# Patient Record
Sex: Female | Born: 1990 | Race: Black or African American | Hispanic: No | Marital: Single | Smoking: Current every day smoker
Health system: Southern US, Community
[De-identification: ages and names within clinical notes are randomized; demographics above are authoritative.]

## PROBLEM LIST (undated history)

## (undated) DIAGNOSIS — D649 Anemia, unspecified: Secondary | ICD-10-CM

## (undated) DIAGNOSIS — J45909 Unspecified asthma, uncomplicated: Secondary | ICD-10-CM

## (undated) HISTORY — PX: LIPOSUCTION: SHX10

## (undated) HISTORY — PX: SALPINGECTOMY: SHX328

## (undated) HISTORY — PX: ABDOMINAL SURGERY: SHX537

## (undated) HISTORY — PX: BREAST SURGERY: SHX581

---

## 2013-08-06 ENCOUNTER — Emergency Department (HOSPITAL_COMMUNITY)
Admission: EM | Admit: 2013-08-06 | Discharge: 2013-08-06 | Disposition: A | Discharge status: Home / Self Care | Payer: Self-pay | Attending: Emergency Medicine | Admitting: Emergency Medicine

## 2013-08-06 ENCOUNTER — Encounter (HOSPITAL_COMMUNITY): Payer: Self-pay | Admitting: Emergency Medicine

## 2013-08-06 DIAGNOSIS — N390 Urinary tract infection, site not specified: Secondary | ICD-10-CM | POA: Insufficient documentation

## 2013-08-06 DIAGNOSIS — F411 Generalized anxiety disorder: Secondary | ICD-10-CM | POA: Insufficient documentation

## 2013-08-06 DIAGNOSIS — N61 Mastitis without abscess: Secondary | ICD-10-CM | POA: Insufficient documentation

## 2013-08-06 DIAGNOSIS — J45909 Unspecified asthma, uncomplicated: Secondary | ICD-10-CM | POA: Insufficient documentation

## 2013-08-06 DIAGNOSIS — R112 Nausea with vomiting, unspecified: Secondary | ICD-10-CM | POA: Insufficient documentation

## 2013-08-06 DIAGNOSIS — F17200 Nicotine dependence, unspecified, uncomplicated: Secondary | ICD-10-CM | POA: Insufficient documentation

## 2013-08-06 DIAGNOSIS — Z3202 Encounter for pregnancy test, result negative: Secondary | ICD-10-CM | POA: Insufficient documentation

## 2013-08-06 DIAGNOSIS — N611 Abscess of the breast and nipple: Secondary | ICD-10-CM

## 2013-08-06 HISTORY — DX: Unspecified asthma, uncomplicated: J45.909

## 2013-08-06 LAB — URINE MICROSCOPIC-ADD ON

## 2013-08-06 LAB — URINALYSIS, ROUTINE W REFLEX MICROSCOPIC
Bilirubin Urine: NEGATIVE
Glucose, UA: NEGATIVE mg/dL
Ketones, ur: NEGATIVE mg/dL
Nitrite: POSITIVE — AB
PROTEIN: 30 mg/dL — AB
Specific Gravity, Urine: 1.027 (ref 1.005–1.030)
Urobilinogen, UA: 1 mg/dL (ref 0.0–1.0)
pH: 7 (ref 5.0–8.0)

## 2013-08-06 LAB — PREGNANCY, URINE: Preg Test, Ur: NEGATIVE

## 2013-08-06 MED ORDER — MORPHINE SULFATE 4 MG/ML IJ SOLN
4.0000 mg | Freq: Once | INTRAMUSCULAR | Status: AC
  Administered 2013-08-06: 4 mg via INTRAVENOUS
  Filled 2013-08-06: qty 1

## 2013-08-06 MED ORDER — SULFAMETHOXAZOLE-TRIMETHOPRIM 800-160 MG PO TABS: 1.0000 | ORAL_TABLET | Freq: Two times a day (BID) | ORAL | Status: DC

## 2013-08-06 MED ORDER — HYDROCODONE-ACETAMINOPHEN 5-325 MG PO TABS: 1.0000 | ORAL_TABLET | ORAL | Status: DC | PRN

## 2013-08-06 NOTE — ED Notes (Signed)
Pt present with NAD- pt reports fever chills and "knots to chest"  Green drainage from left breast from piercing 2 weeks ago. Vomited x 1 in 24 hours.

## 2013-08-06 NOTE — Discharge Instructions (Signed)
Abscess An abscess is an infected area that contains a collection of pus and debris.It can occur in almost any part of the body. An abscess is also known as a furuncle or boil. CAUSES  An abscess occurs when tissue gets infected. This can occur from blockage of oil or sweat glands, infection of hair follicles, or a minor injury to the skin. As the body tries to fight the infection, pus collects in the area and creates pressure under the skin. This pressure causes pain. People with weakened immune systems have difficulty fighting infections and get certain abscesses more often.  SYMPTOMS Usually an abscess develops on the skin and becomes a painful mass that is red, warm, and tender. If the abscess forms under the skin, you may feel a moveable soft area under the skin. Some abscesses break open (rupture) on their own, but most will continue to get worse without care. The infection can spread deeper into the body and eventually into the bloodstream, causing you to feel ill.  DIAGNOSIS  Your caregiver will take your medical history and perform a physical exam. A sample of fluid may also be taken from the abscess to determine what is causing your infection. TREATMENT  Your caregiver may prescribe antibiotic medicines to fight the infection. However, taking antibiotics alone usually does not cure an abscess. Your caregiver may need to make a small cut (incision) in the abscess to drain the pus. In some cases, gauze is packed into the abscess to reduce pain and to continue draining the area. HOME CARE INSTRUCTIONS   Only take over-the-counter or prescription medicines for pain, discomfort, or fever as directed by your caregiver.  If you were prescribed antibiotics, take them as directed. Finish them even if you start to feel better.  If gauze is used, follow your caregiver's directions for changing the gauze.  To avoid spreading the infection:  Keep your draining abscess covered with a  bandage.  Wash your hands well.  Do not share personal care items, towels, or whirlpools with others.  Avoid skin contact with others.  Keep your skin and clothes clean around the abscess.  Keep all follow-up appointments as directed by your caregiver. SEEK MEDICAL CARE IF:   You have increased pain, swelling, redness, fluid drainage, or bleeding.  You have muscle aches, chills, or a general ill feeling.  You have a fever. MAKE SURE YOU:   Understand these instructions.  Will watch your condition.  Will get help right away if you are not doing well or get worse. Document Released: 02/04/2005 Document Revised: 10/27/2011 Document Reviewed: 07/10/2011 [facility] Patient Information 2014 [place], [place].  Urinary Tract Infection A urinary tract infection (UTI) can occur any place along the urinary tract. The tract includes the kidneys, ureters, bladder, and urethra. A type of germ called bacteria often causes a UTI. UTIs are often helped with antibiotic medicine.  HOME CARE   If given, take antibiotics as told by your doctor. Finish them even if you start to feel better.  Drink enough fluids to keep your pee (urine) clear or pale yellow.  Avoid tea, drinks with caffeine, and bubbly (carbonated) drinks.  Pee often. Avoid holding your pee in for a long time.  Pee before and after having sex (intercourse).  Wipe from front to back after you poop (bowel movement) if you are a woman. Use each tissue only once. GET HELP RIGHT AWAY IF:   You have back pain.  You have lower belly (abdominal) pain.  You have chills.  You feel sick to your stomach (nauseous).  You throw up (vomit).  Your burning or discomfort with peeing does not go away.  You have a fever.  Your symptoms are not better in 3 days. MAKE SURE YOU:   Understand these instructions.  Will watch your condition.  Will get help right away if you are not doing well or get worse. Document Released:  10/14/2007 Document Revised: 01/20/2012 Document Reviewed: 11/26/2011 [facility] Patient Information 2014 [place].

## 2013-08-06 NOTE — ED Notes (Signed)
Patient now reporting that she is having chest pain and pressure. She has been vomiting as well, Patient having multiple anxiety attacks and had infection to new tattoo.

## 2013-08-06 NOTE — ED Provider Notes (Signed)
INCISION AND DRAINAGE Date/Time: 08/06/2013 6:29 PM Performed by: Coral CeoPALMER, Clearnce Leja K Authorized by: Jillyn LedgerPALMER, Aarit Kashuba K Consent: Verbal consent obtained. Risks and benefits: risks, benefits and alternatives were discussed Consent given by: patient Patient identity confirmed: verbally with patient Type: abscess Body area: trunk Location details: left breast Anesthesia: local infiltration Local anesthetic: lidocaine 2% without epinephrine Anesthetic total: 6 ml Patient sedated: no Scalpel size: 11 Incision type: single straight Complexity: simple Drainage: purulent and  bloody Drainage amount: moderate Wound treatment: drain placed Packing material: 1/4 in iodoform gauze Patient tolerance: Patient tolerated the procedure well with no immediate complications.     Greer EeJessica Katlin Laiden Milles PA-C          Jillyn LedgerJessica K Sam Overbeck, PA-C 08/06/13 [phone]

## 2013-08-06 NOTE — ED Provider Notes (Signed)
CSN: [id number]     Arrival date & time 08/06/13  1521 History   First MD Initiated Contact with Patient 08/06/13 1703     Chief complaint: Left breast pain  HPI Patient presents to the emergency room with a primary complaint of left breast pain. The patient states she had a nipple piercing about 2 weeks ago. Gradually since that time she's developed redness and swelling in her left breast in the medial aspect. The area is tender and swollen. She is also felt feverish and chilled. She's also had a couple episodes of nausea and vomiting. Patient denies any abdominal pain. She has noticed an odor to her urine but no discomfort. Patient also had a tattoo on her left arm.  She has not noticed any redness but Past Medical History  Diagnosis Date  . Asthma    History reviewed. No pertinent past surgical history. No family history on file. History  Substance Use Topics  . Smoking status: Current Every Day Smoker  . Smokeless tobacco: Not on file  . Alcohol Use: Yes   OB History   Grav Para Term Preterm Abortions TAB SAB Ect Mult Living                 Review of Systems  All other systems reviewed and are negative.      Allergies  Review of patient's allergies indicates no known allergies.  Home Medications  No current outpatient prescriptions on file. BP 127/65  Pulse 70  Temp(Src) 97.8 F (36.6 C) (Oral)  Resp 18  Wt 195 lb (88.451 kg)  SpO2 100%  LMP 07/22/2013 Physical Exam  Nursing note and vitals reviewed. Constitutional: She appears well-developed and well-nourished. No distress.  HENT:  Head: Normocephalic and atraumatic.  Right Ear: External ear normal.  Left Ear: External ear normal.  Eyes: Conjunctivae are normal. Right eye exhibits no discharge. Left eye exhibits no discharge. No scleral icterus.  Neck: Neck supple. No tracheal deviation present.  Cardiovascular: Normal rate.   Pulmonary/Chest: Effort normal. No stridor. No respiratory distress. Left breast  exhibits tenderness. Left breast exhibits no nipple discharge.    Musculoskeletal: She exhibits no edema.  Neurological: She is alert. Cranial nerve deficit: no gross deficits.  Skin: Skin is warm and dry. No rash noted.  Tattoo on her left forearm with scaling of the skin but no erythema or drainage, no tenderness  Psychiatric: She has a normal mood and affect.    ED Course  Procedures (including critical care time) EMERGENCY DEPARTMENT US SOFT TISSUE INTERPRETATION "Study: Limited Ultrasound of the noted body part in comments below"  INDICATIONS: Soft tissue infection Multiple views of the body part are obtained with a multi-frequency linear probe  PERFORMED BY:  Myself  IMAGES ARCHIVED?: Yes  SIDE:Left  BODY PART:Breast  FINDINGS: Abcess present  LIMITATIONS:  None   INTERPRETATION:  Abcess present  COMMENT:  Will plan on I&D  Labs Review  Labs Reviewed  URINALYSIS, ROUTINE W REFLEX MICROSCOPIC - Abnormal; Notable for the following:    Color, Urine AMBER (*)    APPearance CLOUDY (*)    Hgb urine dipstick LARGE (*)    Protein, ur 30 (*)    Nitrite POSITIVE (*)    Leukocytes, UA MODERATE (*)    All other components within normal limits  URINE MICROSCOPIC-ADD ON - Abnormal; Notable for the following:    Bacteria, UA MANY (*)    All other components within normal limits  PREGNANCY, URINE  I&D performed by PA Palmer under my supervision  MDM   Final diagnoses:  Breast abscess  UTI (lower urinary tract infection)    Will dc home with rx for septra and pain meds.  Septra to cover uti and skin infection/mrsa coverage.  Recommended pt remove her nipple ring.    Celene KrasJon R Rushie Brazel, MD 08/06/13 [phone]

## 2013-08-08 ENCOUNTER — Telehealth (HOSPITAL_COMMUNITY): Payer: Self-pay

## 2013-08-08 NOTE — ED Notes (Signed)
Pt calling regarding when she should get packing removed.  Pt informed to follow up w/ PCP or to return to have packing removed.  She has not called to schedule f/u w/ [facility].

## 2013-08-09 ENCOUNTER — Emergency Department (HOSPITAL_COMMUNITY)
Admission: EM | Admit: 2013-08-09 | Discharge: 2013-08-09 | Disposition: A | Discharge status: Home / Self Care | Payer: Self-pay | Attending: Emergency Medicine | Admitting: Emergency Medicine

## 2013-08-09 ENCOUNTER — Encounter (HOSPITAL_COMMUNITY): Payer: Self-pay | Admitting: Emergency Medicine

## 2013-08-09 DIAGNOSIS — L0390 Cellulitis, unspecified: Secondary | ICD-10-CM

## 2013-08-09 DIAGNOSIS — J45909 Unspecified asthma, uncomplicated: Secondary | ICD-10-CM | POA: Insufficient documentation

## 2013-08-09 DIAGNOSIS — R110 Nausea: Secondary | ICD-10-CM | POA: Insufficient documentation

## 2013-08-09 DIAGNOSIS — Z792 Long term (current) use of antibiotics: Secondary | ICD-10-CM | POA: Insufficient documentation

## 2013-08-09 DIAGNOSIS — R509 Fever, unspecified: Secondary | ICD-10-CM | POA: Insufficient documentation

## 2013-08-09 DIAGNOSIS — R42 Dizziness and giddiness: Secondary | ICD-10-CM | POA: Insufficient documentation

## 2013-08-09 DIAGNOSIS — F17200 Nicotine dependence, unspecified, uncomplicated: Secondary | ICD-10-CM | POA: Insufficient documentation

## 2013-08-09 DIAGNOSIS — Z5189 Encounter for other specified aftercare: Secondary | ICD-10-CM

## 2013-08-09 DIAGNOSIS — N61 Mastitis without abscess: Secondary | ICD-10-CM | POA: Insufficient documentation

## 2013-08-09 MED ORDER — CLINDAMYCIN HCL 150 MG PO CAPS: 300.0000 mg | ORAL_CAPSULE | Freq: Three times a day (TID) | ORAL | Status: DC

## 2013-08-09 MED ORDER — VANCOMYCIN HCL IN DEXTROSE 1-5 GM/200ML-% IV SOLN
1000.0000 mg | INTRAVENOUS | Status: DC
  Filled 2013-08-09: qty 200

## 2013-08-09 NOTE — ED Notes (Signed)
Pt a+ox4, reports here to have packing removed from L breast abcess.  Packing placed x2 days ago.  Pt reports inc in pain to site, 9/10, at this time.  Pt denies fevers/chills.  Skin otherwise PWD.  NAD.

## 2013-08-09 NOTE — ED Provider Notes (Signed)
Medical screening examination/treatment/procedure(s) were conducted as a shared visit with non-physician practitioner(s) and myself.  I personally evaluated the patient during the encounter.   EKG Interpretation None      Patient presents for increasing pain and redness overlying an area of abscess on the left breast. Region is upper medial aspect of the breast. Reviewing the records from previous visit did reveal moderate amount of abscess drainage. She reports increased pain. I recommended repeat I&D to ensure there was no retained pockets of pus. This procedure was performed (see separate note) and no more pus was encountered. The original I&D site is fairly splayed open, there is no significant purulence drainage. I did not feel repacking was necessary. Will increase antibiotic coverage, follow up with general surgery. Return to the ER if symptoms worsen.  Gilda Creasehristopher J. Jujhar Everett, MD 08/09/13 1750

## 2013-08-09 NOTE — ED Provider Notes (Signed)
Procedure scribed for Gilda Creasehristopher J. Zerrick Hanssen by Greggory StallionKayla Andersen, ED scribe at 5:30 PM.   INCISION AND DRAINAGE Performed by: Gilda Creasehristopher J. Blanton Kardell, MD Consent: Verbal consent obtained. Risks and benefits: risks, benefits and alternatives were discussed Type: abscess  Body area: left breast  Anesthesia: local infiltration  Incision was made with a scalpel.  Local anesthetic: lidocaine 2% with epinephrine  Anesthetic total: 8 ml  Complexity: complex Blunt dissection to break up loculations  Drainage: blood  Drainage amount: mild  Packing material: none  Patient tolerance: Patient tolerated the procedure well with no immediate complications.    Gilda Creasehristopher J. Witten Certain, MD 08/09/13 [phone]

## 2013-08-09 NOTE — ED Provider Notes (Signed)
CSN: [id number]     Arrival date & time 08/09/13  1604 History  This chart was scribed for non-physician practitioner, Roxy Horsemanobert Roslind Michaux, PA-C working with Gilda Creasehristopher J. Pollina, MD by Greggory StallionKayla Andersen, ED scribe. This patient was seen in room WTR6/WTR6 and the patient's care was started at 4:57 PM.   Chief Complaint  Patient presents with  . Skin Problem   The history is provided by the patient. No language interpreter was used.   HPI Comments: Karina Cook is a 23 y.o. female who presents to the Emergency Department complaining of a wound check. Pt was seen here 2 days ago and had an abscess to her left breast I&D'd. Packing was placed and states she is here for packing removal. Pt was given Vicodin and Septra. She has had increased pain and redness to the area. She has also been having fevers of 103. She has been having light headedness and nausea for about 10 minutes.   Past Medical History  Diagnosis Date  . Asthma    History reviewed. No pertinent past surgical history. No family history on file. History  Substance Use Topics  . Smoking status: Current Every Day Smoker  . Smokeless tobacco: Not on file  . Alcohol Use: Yes   OB History   Grav Para Term Preterm Abortions TAB SAB Ect Mult Living                 Review of Systems  Constitutional: Positive for fever.  HENT: Negative for congestion.   Eyes: Negative for redness.  Respiratory: Negative for shortness of breath.   Cardiovascular: Negative for chest pain.  Gastrointestinal: Positive for nausea. Negative for abdominal distention.  Musculoskeletal: Negative for gait problem.  Skin:       Abscess.  Neurological: Positive for light-headedness.  Psychiatric/Behavioral: Negative for confusion.   Allergies  Review of patient's allergies indicates no known allergies.  Home Medications   Current Outpatient Rx  Name  Route  Sig  Dispense  Refill  . HYDROcodone-acetaminophen (NORCO/VICODIN) 5-325 MG per tablet   Oral  Take 1-2 tablets by mouth every 4 (four) hours as needed.   16 tablet   0   . sulfamethoxazole-trimethoprim (SEPTRA DS) 800-160 MG per tablet   Oral   Take 1 tablet by mouth 2 (two) times daily.   20 tablet   0    BP 114/63  Pulse 91  Temp(Src) 98.6 F (37 C) (Oral)  Resp 16  SpO2 100%  LMP 08/06/2013  Physical Exam  Nursing note and vitals reviewed. Constitutional: She is oriented to person, place, and time. She appears well-developed and well-nourished. No distress.  HENT:  Head: Normocephalic and atraumatic.  Eyes: Conjunctivae and EOM are normal.  Cardiovascular: Normal rate and regular rhythm.   Pulmonary/Chest: Effort normal and breath sounds normal. No stridor. No respiratory distress.  Abdominal: She exhibits no distension.  Musculoskeletal: She exhibits no edema.  Neurological: She is alert and oriented to person, place, and time. No cranial nerve deficit.  Skin: Skin is warm and dry.  Left breast remarkable for prior incision and drainage wound as well as surrounding cellulitis and erythema with moderate induration. No purulent discharge.   Psychiatric: She has a normal mood and affect.    ED Course  Procedures (including critical care time)  DIAGNOSTIC STUDIES: Oxygen Saturation is 100% on RA, normal by my interpretation.    COORDINATION OF CARE: 5:00 PM-Discussed treatment plan which includes I&D with pt at bedside and pt agreed  to plan.   5:36 PM-Abscess was I&D'd again. Will change antibiotic and give general surgery follow up.   Labs Review Labs Reviewed - No data to display Imaging Review No results found.   EKG Interpretation None      MDM   Final diagnoses:  Cellulitis  Wound check, abscess    Patient with prior breast abscess, which was drained 2 days ago. Complains of worsening pain and erythema. Patient seen by and discussed with Dr. Blinda Leatherwood, who performed bedside I and D. recommend adding clindamycin to double strength Bactrim.  Recommend followup with surgery. Patient understands and agrees to plan. Patient is stable and ready for discharge.  I personally performed the services described in this documentation, which was scribed in my presence. The recorded information has been reviewed and is accurate.    Roxy Horseman, PA-C 08/09/13 1745

## 2013-08-09 NOTE — Discharge Instructions (Signed)
Abscess  Care After  An abscess (also called a boil or furuncle) is an infected area that contains a collection of pus. Signs and symptoms of an abscess include pain, tenderness, redness, or hardness, or you may feel a moveable soft area under your skin. An abscess can occur anywhere in the body. The infection may spread to surrounding tissues causing cellulitis. A cut (incision) by the surgeon was made over your abscess and the pus was drained out. Gauze may have been packed into the space to provide a drain that will allow the cavity to heal from the inside outwards. The boil may be painful for 5 to 7 days. Most people with a boil do not have high fevers. Your abscess, if seen early, may not have localized, and may not have been lanced. If not, another appointment may be required for this if it does not get better on its own or with medications.  HOME CARE INSTRUCTIONS   · Only take over-the-counter or prescription medicines for pain, discomfort, or fever as directed by your caregiver.  · When you bathe, soak and then remove gauze or iodoform packs at least daily or as directed by your caregiver. You may then wash the wound gently with mild soapy water. Repack with gauze or do as your caregiver directs.  SEEK IMMEDIATE MEDICAL CARE IF:   · You develop increased pain, swelling, redness, drainage, or bleeding in the wound site.  · You develop signs of generalized infection including muscle aches, chills, fever, or a general ill feeling.  · An oral temperature above 102° F (38.9° C) develops, not controlled by medication.  See your caregiver for a recheck if you develop any of the symptoms described above. If medications (antibiotics) were prescribed, take them as directed.  Document Released: 11/13/2004 Document Revised: 07/20/2011 Document Reviewed: 07/11/2007  [employer]® Patient Information ©2014 [employer].

## 2016-09-01 ENCOUNTER — Encounter (HOSPITAL_COMMUNITY): Payer: Self-pay | Admitting: Emergency Medicine

## 2016-09-01 ENCOUNTER — Emergency Department (HOSPITAL_COMMUNITY)
Admission: EM | Admit: 2016-09-01 | Discharge: 2016-09-02 | Disposition: A | Discharge status: Home / Self Care | Payer: Medicaid - Out of State | Attending: Emergency Medicine | Admitting: Emergency Medicine

## 2016-09-01 DIAGNOSIS — R102 Pelvic and perineal pain unspecified side: Secondary | ICD-10-CM

## 2016-09-01 DIAGNOSIS — F17200 Nicotine dependence, unspecified, uncomplicated: Secondary | ICD-10-CM | POA: Insufficient documentation

## 2016-09-01 DIAGNOSIS — Z79899 Other long term (current) drug therapy: Secondary | ICD-10-CM | POA: Diagnosis not present

## 2016-09-01 DIAGNOSIS — N9089 Other specified noninflammatory disorders of vulva and perineum: Secondary | ICD-10-CM | POA: Diagnosis not present

## 2016-09-01 DIAGNOSIS — N939 Abnormal uterine and vaginal bleeding, unspecified: Secondary | ICD-10-CM

## 2016-09-01 DIAGNOSIS — J45909 Unspecified asthma, uncomplicated: Secondary | ICD-10-CM | POA: Diagnosis not present

## 2016-09-01 DIAGNOSIS — R21 Rash and other nonspecific skin eruption: Secondary | ICD-10-CM | POA: Insufficient documentation

## 2016-09-01 DIAGNOSIS — N949 Unspecified condition associated with female genital organs and menstrual cycle: Secondary | ICD-10-CM
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
## 2016-09-02 ENCOUNTER — Emergency Department (HOSPITAL_COMMUNITY): Payer: Medicaid - Out of State

## 2016-09-02 LAB — URINALYSIS, ROUTINE W REFLEX MICROSCOPIC
BILIRUBIN URINE: NEGATIVE
Glucose, UA: NEGATIVE mg/dL
HGB URINE DIPSTICK: NEGATIVE
Ketones, ur: NEGATIVE mg/dL
Leukocytes, UA: NEGATIVE
Nitrite: NEGATIVE
PROTEIN: NEGATIVE mg/dL
Specific Gravity, Urine: 1.01 (ref 1.005–1.030)
pH: 7 (ref 5.0–8.0)

## 2016-09-02 LAB — CBC WITH DIFFERENTIAL/PLATELET
Basophils Absolute: 0 10*3/uL (ref 0.0–0.1)
Basophils Relative: 0 %
Eosinophils Absolute: 0.2 10*3/uL (ref 0.0–0.7)
Eosinophils Relative: 2 %
HEMATOCRIT: 37 % (ref 36.0–46.0)
Hemoglobin: 11.8 g/dL — ABNORMAL LOW (ref 12.0–15.0)
LYMPHS ABS: 3.1 10*3/uL (ref 0.7–4.0)
Lymphocytes Relative: 29 %
MCH: 24.9 pg — ABNORMAL LOW (ref 26.0–34.0)
MCHC: 31.9 g/dL (ref 30.0–36.0)
MCV: 78.2 fL (ref 78.0–100.0)
MONO ABS: 0.7 10*3/uL (ref 0.1–1.0)
MONOS PCT: 7 %
Neutro Abs: 6.8 10*3/uL (ref 1.7–7.7)
Neutrophils Relative %: 63 %
Platelets: 235 10*3/uL (ref 150–400)
RBC: 4.73 MIL/uL (ref 3.87–5.11)
RDW: 15.9 % — ABNORMAL HIGH (ref 11.5–15.5)
WBC: 10.9 10*3/uL — ABNORMAL HIGH (ref 4.0–10.5)

## 2016-09-02 LAB — COMPREHENSIVE METABOLIC PANEL
ALT: 14 U/L (ref 14–54)
AST: 18 U/L (ref 15–41)
Albumin: 4 g/dL (ref 3.5–5.0)
Alkaline Phosphatase: 60 U/L (ref 38–126)
Anion gap: 7 (ref 5–15)
BILIRUBIN TOTAL: 0.6 mg/dL (ref 0.3–1.2)
BUN: 6 mg/dL (ref 6–20)
CALCIUM: 9.5 mg/dL (ref 8.9–10.3)
CO2: 26 mmol/L (ref 22–32)
CREATININE: 0.78 mg/dL (ref 0.44–1.00)
Chloride: 104 mmol/L (ref 101–111)
Glucose, Bld: 95 mg/dL (ref 65–99)
Potassium: 4.1 mmol/L (ref 3.5–5.1)
Sodium: 137 mmol/L (ref 135–145)
TOTAL PROTEIN: 7.3 g/dL (ref 6.5–8.1)

## 2016-09-02 LAB — GC/CHLAMYDIA PROBE AMP (~~LOC~~) NOT AT ARMC
Chlamydia: NEGATIVE
NEISSERIA GONORRHEA: NEGATIVE

## 2016-09-02 LAB — RAPID URINE DRUG SCREEN, HOSP PERFORMED
Amphetamines: NOT DETECTED
Barbiturates: NOT DETECTED
Benzodiazepines: NOT DETECTED
COCAINE: NOT DETECTED
Opiates: NOT DETECTED
Tetrahydrocannabinol: NOT DETECTED

## 2016-09-02 LAB — WET PREP, GENITAL
SPERM: NONE SEEN
TRICH WET PREP: NONE SEEN
Yeast Wet Prep HPF POC: NONE SEEN

## 2016-09-02 LAB — HIV ANTIBODY (ROUTINE TESTING W REFLEX): HIV Screen 4th Generation wRfx: NONREACTIVE

## 2016-09-02 LAB — I-STAT BETA HCG BLOOD, ED (MC, WL, AP ONLY): I-stat hCG, quantitative: <5 m[IU]/mL (ref <5)

## 2016-09-02 LAB — RPR: RPR Ser Ql: NONREACTIVE

## 2016-09-02 MED ORDER — ONDANSETRON 4 MG PO TBDP: 4.0000 mg | ORAL_TABLET | Freq: Three times a day (TID) | ORAL | 0 refills | Status: DC | PRN

## 2016-09-02 MED ORDER — LIDOCAINE HCL (PF) 1 % IJ SOLN
2.0000 mL | Freq: Once | INTRAMUSCULAR | Status: AC
  Administered 2016-09-02: 2 mL
  Filled 2016-09-02: qty 5

## 2016-09-02 MED ORDER — SODIUM CHLORIDE 0.9 % IV SOLN
INTRAVENOUS | Status: AC
  Administered 2016-09-02: 02:00:00 via INTRAVENOUS

## 2016-09-02 MED ORDER — DOXYCYCLINE HYCLATE 100 MG PO CAPS: 100.0000 mg | ORAL_CAPSULE | Freq: Two times a day (BID) | ORAL | 0 refills | Status: DC

## 2016-09-02 MED ORDER — HYDROCODONE-ACETAMINOPHEN 5-325 MG PO TABS: 1.0000 | ORAL_TABLET | ORAL | 0 refills | Status: DC | PRN

## 2016-09-02 MED ORDER — METOCLOPRAMIDE HCL 5 MG/ML IJ SOLN
10.0000 mg | Freq: Once | INTRAMUSCULAR | Status: AC
  Administered 2016-09-02: 10 mg via INTRAVENOUS
  Filled 2016-09-02: qty 2

## 2016-09-02 MED ORDER — CEFTRIAXONE SODIUM 250 MG IJ SOLR
250.0000 mg | Freq: Once | INTRAMUSCULAR | Status: AC
  Administered 2016-09-02: 250 mg via INTRAMUSCULAR
  Filled 2016-09-02: qty 250

## 2016-09-02 MED ORDER — KETOROLAC TROMETHAMINE 60 MG/2ML IM SOLN
30.0000 mg | Freq: Once | INTRAMUSCULAR | Status: AC
  Administered 2016-09-02: 30 mg via INTRAMUSCULAR
  Filled 2016-09-02: qty 2

## 2016-09-02 MED ORDER — ONDANSETRON 4 MG PO TBDP
4.0000 mg | ORAL_TABLET | Freq: Once | ORAL | Status: AC
  Administered 2016-09-02: 4 mg via ORAL
  Filled 2016-09-02: qty 1

## 2016-09-02 NOTE — ED Notes (Signed)
Pt given ginger ale per request

## 2016-09-02 NOTE — Discharge Instructions (Signed)
Please read and follow all provided instructions.  Your diagnoses today include:  1. Abnormal vaginal bleeding   2. Pelvic pain   3. Genital lesion, female     Tests performed today include:  Blood counts and electrolytes  Urine test to look for infection and pregnancy (in women)  Ultrasound - normal appearing except for varicose veins in the pelvis  Vital signs. See below for your results today.   Medications prescribed:   Vicodin (hydrocodone/acetaminophen) - narcotic pain medication  DO NOT drive or perform any activities that require you to be awake and alert because this medicine can make you drowsy. BE VERY CAREFUL not to take multiple medicines containing Tylenol (also called acetaminophen). Doing so can lead to an overdose which can damage your liver and cause liver failure and possibly death.   Zofran (ondansetron) - for nausea and vomiting   Doxycycline - antibiotic  You have been prescribed an antibiotic medicine: take the entire course of medicine even if you are feeling better. Stopping early can cause the antibiotic not to work.  Take any prescribed medications only as directed.  Home care instructions:   Follow any educational materials contained in this packet.  Follow-up instructions: Please follow-up with your primary care provider in the next 3 days for further evaluation of your symptoms.    Return instructions:  SEEK IMMEDIATE MEDICAL ATTENTION IF:  The pain does not go away or becomes severe   A temperature above 101F develops   Repeated vomiting occurs (multiple episodes)   The pain becomes localized to portions of the abdomen. The right side could possibly be appendicitis. In an adult, the left lower portion of the abdomen could be colitis or diverticulitis.   Blood is being passed in stools or vomit (bright red or black tarry stools)   You develop chest pain, difficulty breathing, dizziness or fainting, or become confused, poorly  responsive, or inconsolable (young children)  If you have any other emergent concerns regarding your health  Additional Information: Abdominal (belly) pain can be caused by many things. Your caregiver performed an examination and possibly ordered blood/urine tests and imaging (CT scan, x-rays, ultrasound). Many cases can be observed and treated at home after initial evaluation in the emergency department. Even though you are being discharged home, abdominal pain can be unpredictable. Therefore, you need a repeated exam if your pain does not resolve, returns, or worsens. Most patients with abdominal pain don't have to be admitted to the hospital or have surgery, but serious problems like appendicitis and gallbladder attacks can start out as nonspecific pain. Many abdominal conditions cannot be diagnosed in one visit, so follow-up evaluations are very important.  Your vital signs today were: BP 101/62    Pulse (!) 53    Temp 98.4 F (36.9 C) (Oral)    Resp 16    Ht  (1.727 m)    Wt 102.1 kg    SpO2 98%    BMI 34.21 kg/m  If your blood pressure (bp) was elevated above 135/85 this visit, please have this repeated by your doctor within one month. --------------

## 2016-09-02 NOTE — ED Notes (Signed)
Pt taken to US
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
## 2016-09-04 LAB — HSV CULTURE AND TYPING

## 2016-09-10 ENCOUNTER — Encounter (HOSPITAL_COMMUNITY): Payer: Self-pay

## 2016-09-10 ENCOUNTER — Emergency Department (HOSPITAL_COMMUNITY)
Admission: EM | Admit: 2016-09-10 | Discharge: 2016-09-10 | Discharge status: Against Medical Advice | Payer: Medicaid - Out of State | Attending: Emergency Medicine | Admitting: Emergency Medicine

## 2016-09-10 DIAGNOSIS — J45909 Unspecified asthma, uncomplicated: Secondary | ICD-10-CM | POA: Insufficient documentation

## 2016-09-10 DIAGNOSIS — F17200 Nicotine dependence, unspecified, uncomplicated: Secondary | ICD-10-CM | POA: Insufficient documentation

## 2016-09-10 DIAGNOSIS — R1084 Generalized abdominal pain: Secondary | ICD-10-CM | POA: Insufficient documentation

## 2016-09-10 LAB — COMPREHENSIVE METABOLIC PANEL
ALT: 14 U/L (ref 14–54)
AST: 20 U/L (ref 15–41)
Albumin: 4 g/dL (ref 3.5–5.0)
Alkaline Phosphatase: 56 U/L (ref 38–126)
Anion gap: 5 (ref 5–15)
BUN: 12 mg/dL (ref 6–20)
CHLORIDE: 104 mmol/L (ref 101–111)
CO2: 27 mmol/L (ref 22–32)
Calcium: 9 mg/dL (ref 8.9–10.3)
Creatinine, Ser: 0.89 mg/dL (ref 0.44–1.00)
Glucose, Bld: 100 mg/dL — ABNORMAL HIGH (ref 65–99)
POTASSIUM: 3.6 mmol/L (ref 3.5–5.1)
Sodium: 136 mmol/L (ref 135–145)
Total Bilirubin: 0.6 mg/dL (ref 0.3–1.2)
Total Protein: 7.3 g/dL (ref 6.5–8.1)

## 2016-09-10 LAB — I-STAT BETA HCG BLOOD, ED (MC, WL, AP ONLY)

## 2016-09-10 LAB — URINALYSIS, ROUTINE W REFLEX MICROSCOPIC
BILIRUBIN URINE: NEGATIVE
Glucose, UA: NEGATIVE mg/dL
Ketones, ur: NEGATIVE mg/dL
Nitrite: NEGATIVE
PH: 5 (ref 5.0–8.0)
Protein, ur: 30 mg/dL — AB
SPECIFIC GRAVITY, URINE: 1.029 (ref 1.005–1.030)

## 2016-09-10 LAB — CBC
HEMATOCRIT: 38.3 % (ref 36.0–46.0)
Hemoglobin: 11.9 g/dL — ABNORMAL LOW (ref 12.0–15.0)
MCH: 24.4 pg — ABNORMAL LOW (ref 26.0–34.0)
MCHC: 31.1 g/dL (ref 30.0–36.0)
MCV: 78.6 fL (ref 78.0–100.0)
Platelets: 223 10*3/uL (ref 150–400)
RBC: 4.87 MIL/uL (ref 3.87–5.11)
RDW: 15.2 % (ref 11.5–15.5)
WBC: 9.5 10*3/uL (ref 4.0–10.5)

## 2016-09-10 LAB — LIPASE, BLOOD: LIPASE: 19 U/L (ref 11–51)

## 2016-09-10 MED ORDER — SODIUM CHLORIDE 0.9 % IV BOLUS (SEPSIS): 1000.0000 mL | Freq: Once | INTRAVENOUS | Status: DC

## 2016-09-10 MED ORDER — MORPHINE SULFATE (PF) 4 MG/ML IV SOLN
4.0000 mg | Freq: Once | INTRAVENOUS | Status: AC
  Administered 2016-09-10: 4 mg via INTRAVENOUS
  Filled 2016-09-10: qty 1

## 2016-09-10 MED ORDER — ONDANSETRON 4 MG PO TBDP
ORAL_TABLET | ORAL | Status: AC
  Filled 2016-09-10: qty 1

## 2016-09-10 MED ORDER — SODIUM CHLORIDE 0.9 % IV BOLUS (SEPSIS)
1000.0000 mL | Freq: Once | INTRAVENOUS | Status: AC
  Administered 2016-09-10: 1000 mL via INTRAVENOUS

## 2016-09-10 MED ORDER — IOPAMIDOL (ISOVUE-300) INJECTION 61%
INTRAVENOUS | Status: AC
  Filled 2016-09-10: qty 100

## 2016-09-10 MED ORDER — METOCLOPRAMIDE HCL 5 MG/ML IJ SOLN
10.0000 mg | Freq: Once | INTRAMUSCULAR | Status: AC
  Administered 2016-09-10: 10 mg via INTRAVENOUS
  Filled 2016-09-10: qty 2

## 2016-09-10 MED ORDER — ONDANSETRON 4 MG PO TBDP
4.0000 mg | ORAL_TABLET | Freq: Once | ORAL | Status: AC | PRN
  Administered 2016-09-10: 4 mg via ORAL

## 2016-09-10 NOTE — ED Notes (Signed)
Pt returned to ER, IV is out and pt given birth control and charger

## 2016-09-10 NOTE — ED Notes (Signed)
Pt still not in room, pt was called to see see if patient will return for birth control and IV. Pt did not answer phone, provider aware

## 2016-09-10 NOTE — ED Triage Notes (Signed)
Per Pt, Pt is coming from home with complaints of generalized abdominal pain that started five days ago. Pt was seen at the ED and reports not feeling any better. Reports nausea, vomiting, and vaginal bleeding x five days. No Hx of Vaginal bleeding.

## 2016-09-10 NOTE — ED Notes (Signed)
Provider went to check on patient, pt not in room and found iv fluid leaking over bed. Pt was never angry or states wanting to leave. Will call pt and see if patient is in bathroom

## 2016-09-10 NOTE — ED Notes (Signed)
Pt was contacted and said she may return to the ER

## 2016-09-10 NOTE — ED Provider Notes (Signed)
[facility] Provider Note   CSN: [id number] Arrival date & time: 09/10/16  1729     History   Chief Complaint Chief Complaint  Patient presents with  . Abdominal Pain    HPI Karina Cook is a 26 y.o. female.  HPI   Karina Cook is a 26 y.o. female, with a history of Asthma, presenting to the ED with abdominal pain for at least hte last week. Cramping, generalized, constant abdominal pain, worse with movement, rated 9/10. Tried ibuprofen, tylenol, and vicodin without relief.   Previously complaining of vaginal bleeding, but none for past two days. Endorses greater than five instance of emesis over past 24 hours. Increased vaginal discharge described as clear and white.  Saw her OBGYN on April 30, but was told they couldn't do a good exam because she was bleeding too much.   Denies urinary symptoms, fever/chills, diarrhea, or any other complaints.  States she hasn't had a regular menstrual cycle since her tubal ligation in 2016. Patient adds she typically has systolic blood pressures around 100.  Past Medical History:  Diagnosis Date  . Asthma     There are no active problems to display for this patient.   History reviewed. No pertinent surgical history.  OB History    No data available       Home Medications    Prior to Admission medications   Medication Sig Start Date End Date Taking? Authorizing Provider  clindamycin (CLEOCIN) 150 MG capsule Take 2 capsules (300 mg total) by mouth 3 (three) times daily. May dispense as 150mg  capsules 08/09/13   Roxy Horsemanobert Browning, PA-C  doxycycline (VIBRAMYCIN) 100 MG capsule Take 1 capsule (100 mg total) by mouth 2 (two) times daily. 09/02/16   Renne CriglerJoshua Geiple, PA-C  HYDROcodone-acetaminophen (NORCO/VICODIN) 5-325 MG tablet Take 1-2 tablets by mouth every 4 (four) hours as needed. 09/02/16   Renne CriglerJoshua Geiple, PA-C  ondansetron (ZOFRAN ODT) 4 MG disintegrating tablet Take 1 tablet (4 mg total) by mouth every 8 (eight) hours as needed  for nausea or vomiting. 09/02/16   Renne CriglerJoshua Geiple, PA-C  sulfamethoxazole-trimethoprim (SEPTRA DS) 800-160 MG per tablet Take 1 tablet by mouth 2 (two) times daily. 08/06/13   Linwood DibblesJon Knapp, MD    Family History No family history on file.  Social History Social History  Substance Use Topics  . Smoking status: Current Every Day Smoker  . Smokeless tobacco: Never Used  . Alcohol use Yes     Allergies   Fish allergy   Review of Systems Review of Systems  Constitutional: Negative for chills, diaphoresis and fever.  Respiratory: Negative for shortness of breath.   Cardiovascular: Negative for chest pain.  Gastrointestinal: Positive for abdominal pain, nausea and vomiting. Negative for diarrhea.  Genitourinary: Positive for vaginal discharge. Negative for dysuria, frequency and pelvic pain.  Musculoskeletal: Negative for back pain.  All other systems reviewed and are negative.    Physical Exam Updated Vital Signs BP (!) 98/57   Pulse 63   Temp 98.5 F (36.9 C) (Oral)   Resp 14   Ht 5\' 8"  (1.727 m)   Wt 100.7 kg   SpO2 100%   BMI 33.75 kg/m   Physical Exam  Constitutional: She appears well-developed and well-nourished. No distress.  HENT:  Head: Normocephalic and atraumatic.  Eyes: Conjunctivae are normal.  Neck: Neck supple.  Cardiovascular: Normal rate, regular rhythm, normal heart sounds and intact distal pulses.   Pulmonary/Chest: Effort normal and breath sounds normal. No respiratory distress.  Abdominal:  Soft. There is generalized tenderness. There is no guarding and no CVA tenderness.  Musculoskeletal: She exhibits no edema.  Lymphadenopathy:    She has no cervical adenopathy.  Neurological: She is alert.  Skin: Skin is warm and dry. She is not diaphoretic.  Psychiatric: She has a normal mood and affect. Her behavior is normal.  Nursing note and vitals reviewed.    ED Treatments / Results  Labs (all labs ordered are listed, but only abnormal results are  displayed) Labs Reviewed  COMPREHENSIVE METABOLIC PANEL - Abnormal; Notable for the following:       Result Value   Glucose, Bld 100 (*)    All other components within normal limits  CBC - Abnormal; Notable for the following:    Hemoglobin 11.9 (*)    MCH 24.4 (*)    All other components within normal limits  URINALYSIS, ROUTINE W REFLEX MICROSCOPIC - Abnormal; Notable for the following:    APPearance HAZY (*)    Hgb urine dipstick SMALL (*)    Protein, ur 30 (*)    Leukocytes, UA LARGE (*)    Bacteria, UA RARE (*)    Squamous Epithelial / LPF 6-30 (*)    All other components within normal limits  URINE CULTURE  LIPASE, BLOOD  I-STAT BETA HCG BLOOD, ED ([facility], [facility], [facility] ONLY)    EKG  EKG Interpretation None       Radiology No results found.  Procedures Procedures (including critical care time)  Medications Ordered in ED Medications  ondansetron (ZOFRAN-ODT) 4 MG disintegrating tablet (not administered)  iopamidol (ISOVUE-300) 61 % injection (not administered)  ondansetron (ZOFRAN-ODT) disintegrating tablet 4 mg (4 mg Oral Given 09/10/16 1800)  morphine 4 MG/ML injection 4 mg (4 mg Intravenous Given 09/10/16 2104)  metoCLOPramide (REGLAN) injection 10 mg (10 mg Intravenous Given 09/10/16 2100)  sodium chloride 0.9 % bolus 1,000 mL (1,000 mLs Intravenous New Bag/Given 09/10/16 2100)    Followed by  sodium chloride 0.9 % bolus 1,000 mL (1,000 mLs Intravenous New Bag/Given 09/10/16 2059)     Initial Impression / Assessment and Plan / ED Course  I have reviewed the triage vital signs and the nursing notes.  Pertinent labs & imaging results that were available during my care of the patient were reviewed by me and considered in my medical decision making (see chart for details).  Clinical Course as of Sep 11 2235  Thu Sep 10, 2016  2145 When I attempted to reassess the patient and her pain, the patient's room was empty. Her gown and monitoring equipment was on the bed. Her IV tubing  was laying on the bed and fluids were still running.   [SJ]    Clinical Course User Index [SJ] Anselm Pancoast, PA-C     Patient presents for generalized abdominal pain and vomiting. Patient's blood pressure seemed to respond well to fluids. Patient eloped prior to reevaluation and prior to CT scan. Unit secretary made attempts to contact the patient.   Vitals:   09/10/16 1930 09/10/16 1945 09/10/16 2030 09/10/16 2100  BP: (!) 98/57 95/62 95/61  101/69  Pulse: 63     Resp:      Temp:      TempSrc:      SpO2: 100% 100%    Weight:      Height:         Final Clinical Impressions(s) / ED Diagnoses   Final diagnoses:  None    New Prescriptions New Prescriptions  No medications on file     Anselm Pancoast, Cordelia Poche 09/10/16 2238    Melene Plan, DO 09/10/16 2249

## 2016-09-12 LAB — URINE CULTURE

## 2018-06-26 IMAGING — US US TRANSVAGINAL NON-OB
1 series · 14 of 25 positions shown · non-contrast
Comparison: None

CLINICAL DATA: Pelvic pain and vaginal bleeding. G2 P2. LMP 2 years
ago. Status post bilateral tubal ligation.

EXAM:
TRANSABDOMINAL AND TRANSVAGINAL ULTRASOUND OF PELVIS
TECHNIQUE: Both transabdominal and transvaginal ultrasound examinations of the
pelvis were performed. Transabdominal technique was performed for
global imaging of the pelvis including uterus, ovaries, adnexal
regions, and pelvic cul-de-sac. It was necessary to proceed with
endovaginal exam following the transabdominal exam to visualize the
endometrium and ovaries.

[Series 1: us transvaginal non-ob · 0.24mm/px · 14 of 59 slices shown]
[im 1/59]
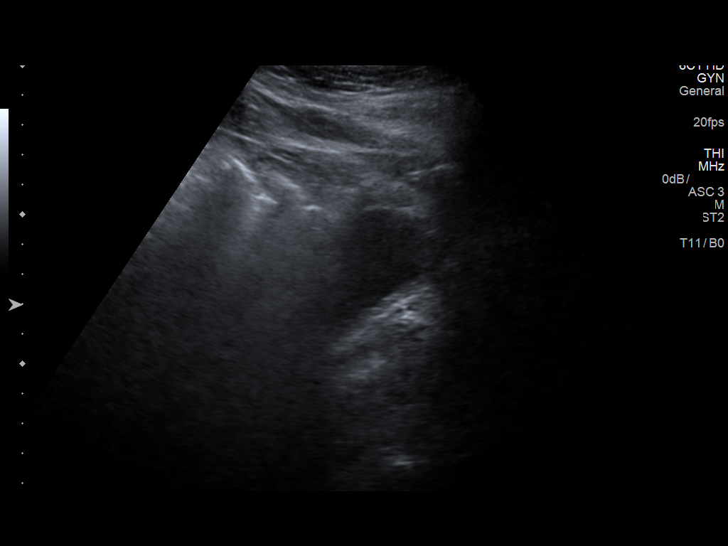
[im 5/59]
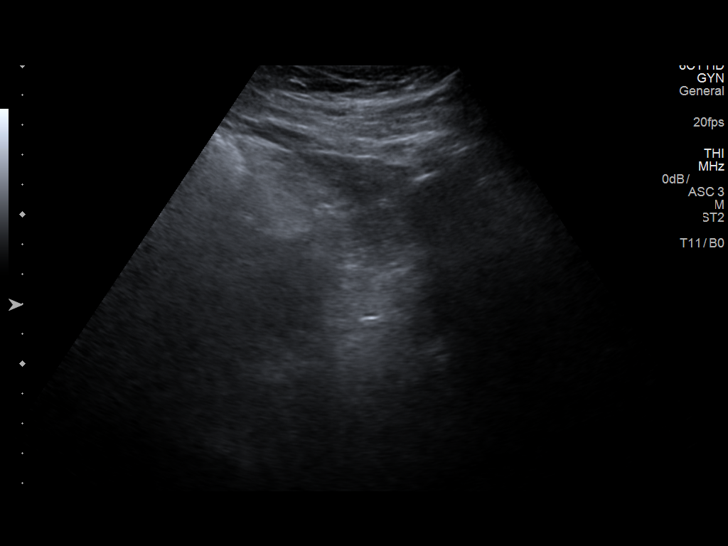
[im 10/59]
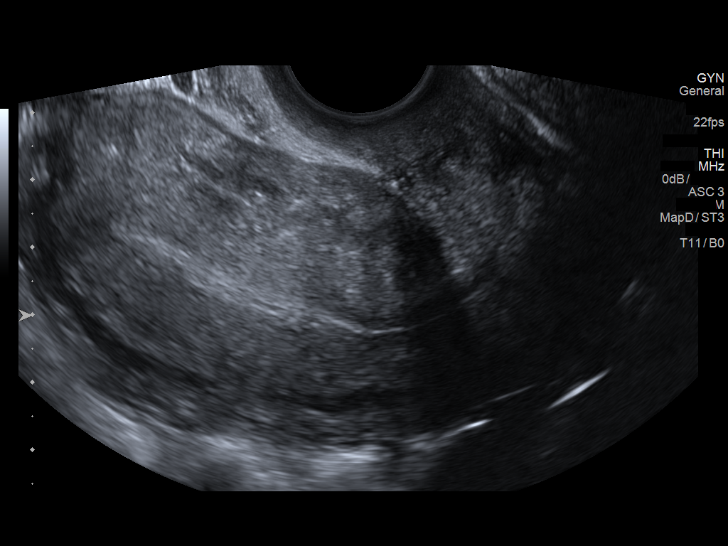
[im 15/59]
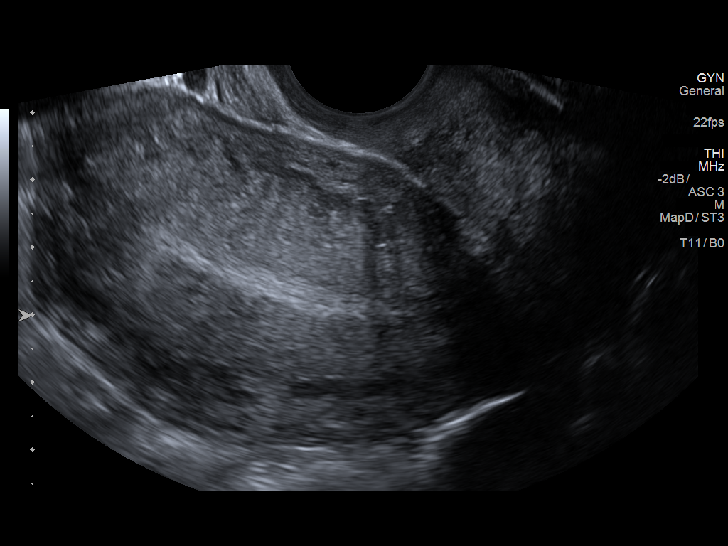
[im 20/59]
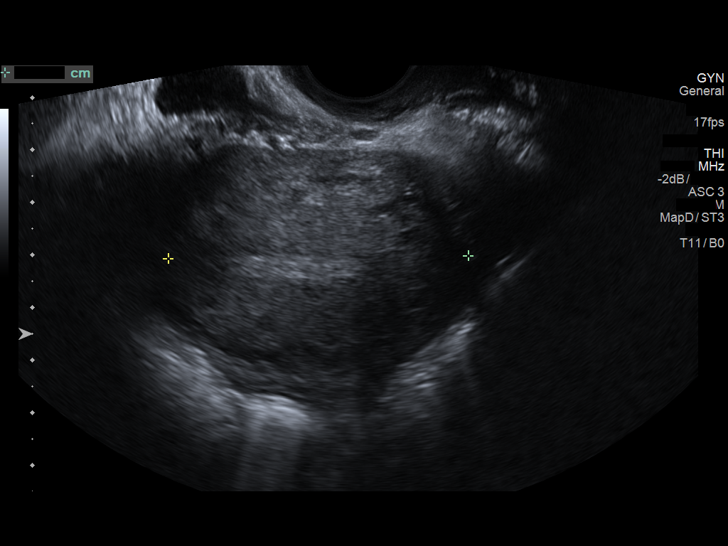
[im 22/59]
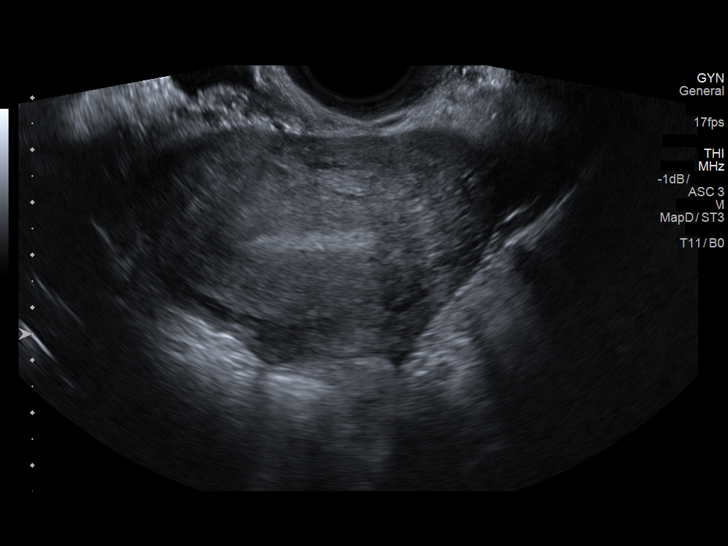
[im 27/59]
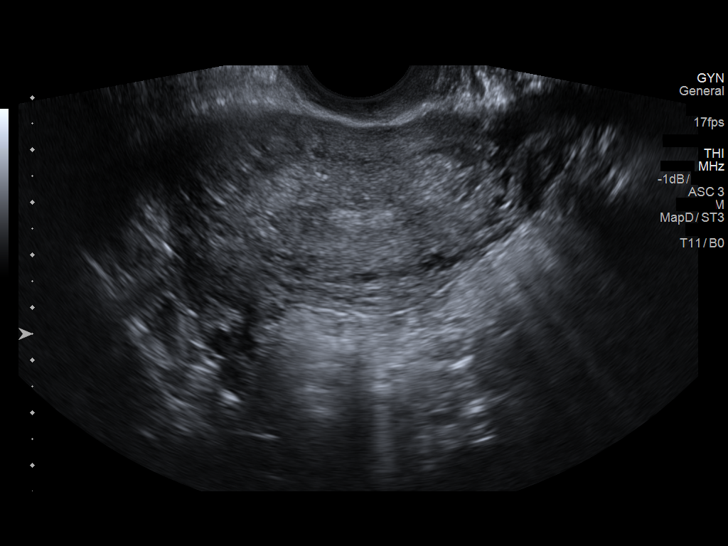
[im 32/59]
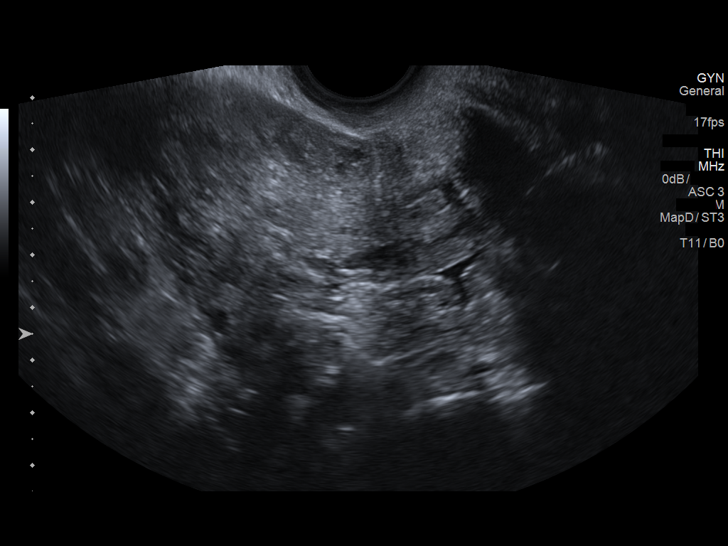
[im 37/59]
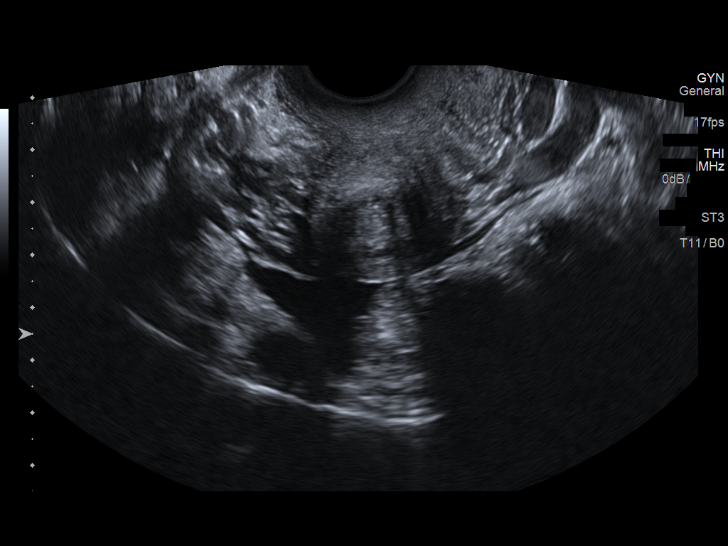
[im 39/59]
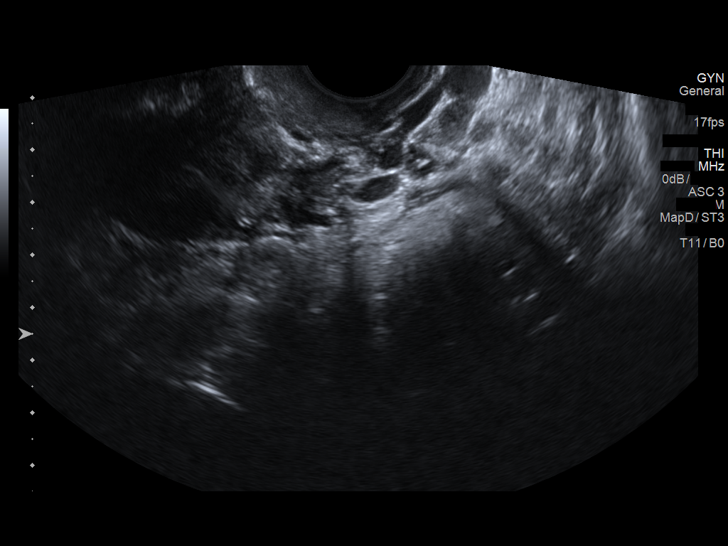
[im 44/59]
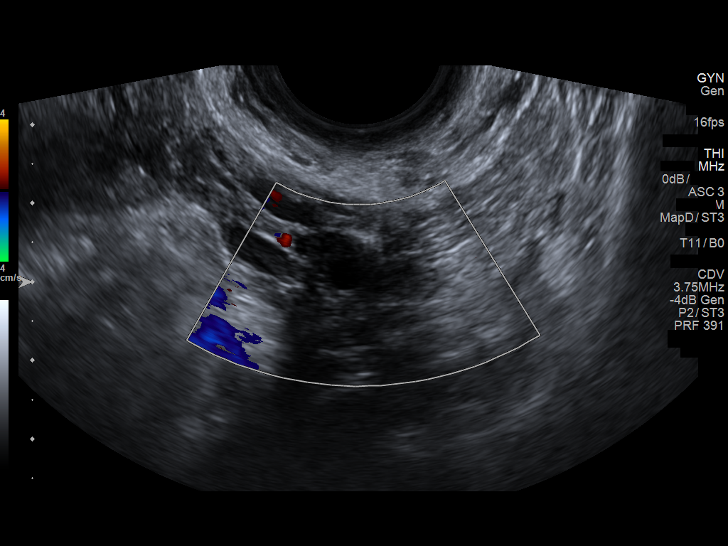
[im 49/59]
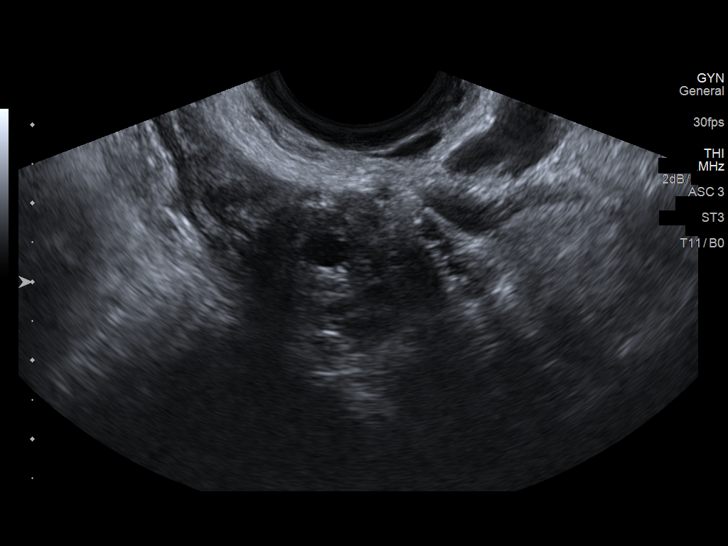
[im 54/59]
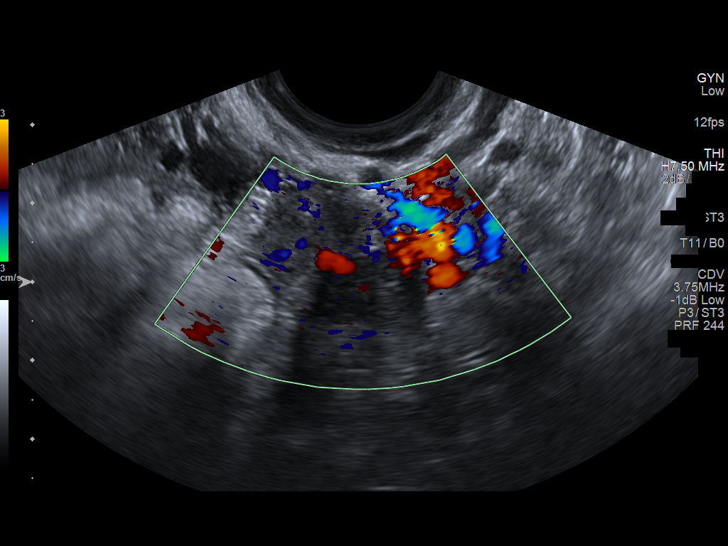
[im 59/59]
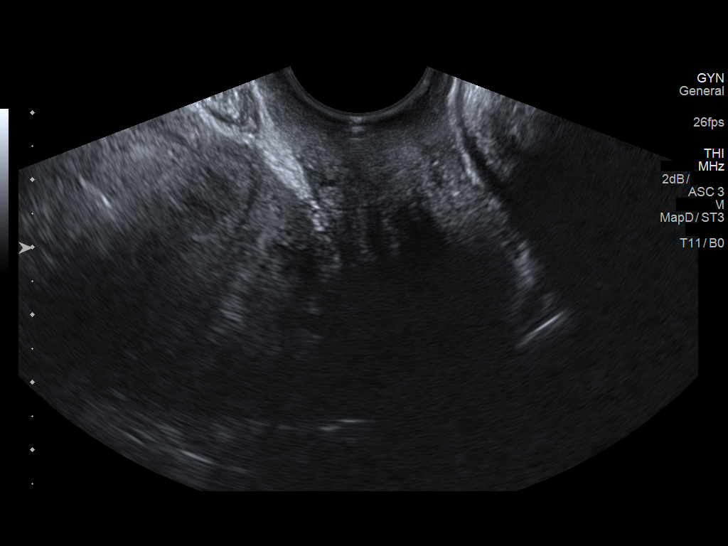

[14 of 25 positions shown; findings below may reference images not displayed]

FINDINGS: Uterus

Measurements: 8.5 x 4.5 x 5.7 cm. No fibroids or other mass
visualized.

Endometrium

Thickness: 4.8 mm.  No focal abnormality visualized.

Right ovary

Measurements: 3.2 x 1.8 x 1.7 cm. Normal appearance/no adnexal mass.

Left ovary

Measurements: 2.5 x 1.6 x 1.7 cm. Normal appearance/no adnexal mass.

Other findings

No abnormal free fluid. Prominent vasculature is noted within both
adnexae.
IMPRESSION: Prominent bilateral adnexal blood vessels, of uncertain
significance. This finding may be seen in the setting of pelvic
congestion syndrome. No other pelvic abnormality.

## 2023-03-16 ENCOUNTER — Encounter (HOSPITAL_COMMUNITY): Payer: Self-pay

## 2023-03-16 ENCOUNTER — Ambulatory Visit (HOSPITAL_COMMUNITY)
Admission: RE | Admit: 2023-03-16 | Discharge: 2023-03-16 | Disposition: A | Discharge status: Home / Self Care | Payer: Medicaid Other | Source: Ambulatory Visit | Attending: Family Medicine | Admitting: Family Medicine

## 2023-03-16 VITALS — BP 124/72 | HR 58 | Temp 98.2°F | Resp 18

## 2023-03-16 DIAGNOSIS — N760 Acute vaginitis: Secondary | ICD-10-CM | POA: Diagnosis not present

## 2023-03-16 MED ORDER — METRONIDAZOLE 500 MG PO TABS: 500.0000 mg | ORAL_TABLET | Freq: Two times a day (BID) | ORAL | 0 refills | Status: AC

## 2023-03-16 NOTE — Discharge Instructions (Signed)
Staff will notify you if there is anything positive on the swab  Take metronidazole 500 mg--1 tablet 2 times daily for 7 days.  Avoid drinking alcohol within 72 hours of taking this medication

## 2023-03-16 NOTE — ED Triage Notes (Signed)
Pt states that she gets BV a lot if there is any change in her routine. She is having "extra wetness" and ammonia smell.

## 2023-03-16 NOTE — ED Provider Notes (Signed)
[facility]    CSN: [id number] Arrival date & time: 03/16/23  1700      History   Chief Complaint Chief Complaint  Patient presents with   Vaginal Itching    HPI Karina Cook is a 32 y.o. female.   Here for vaginal discharge that is thin and watery and an abnormal smell, she states like ammonia.  She relates that she has had recurrent BV, though she has not had it in a while using boric acid soap.  He quit using that type of soap and in the last 5 days she has had the above symptoms.  No fever and no dysuria and no abdominal pain.    She is allergic to hydrocodone.  Last menstrual cycle was October 22    Past Medical History:  Diagnosis Date   Asthma     There are no problems to display for this patient.   History reviewed. No pertinent surgical history.  OB History   No obstetric history on file.      Home Medications    Prior to Admission medications   Medication Sig Start Date End Date Taking? Authorizing Provider  metroNIDAZOLE (FLAGYL) 500 MG tablet Take 1 tablet (500 mg total) by mouth 2 (two) times daily for 7 days. 03/16/23 03/23/23 Yes Cynara Tatham, Janace Aris, MD  EPINEPHrine (EPIPEN 2-PAK) 0.3 mg/0.3 mL IJ SOAJ injection Inject into the muscle. 04/02/21   [provider]    Family History History reviewed. No pertinent family history.  Social History Social History   Tobacco Use   Smoking status: Every Day   Smokeless tobacco: Never  Vaping Use   Vaping status: Never Used  Substance Use Topics   Alcohol use: Yes   Drug use: No     Allergies   Fish allergy and Hydrocodone   Review of Systems Review of Systems   Physical Exam Triage Vital Signs ED Triage Vitals  Encounter Vitals Group     BP 03/16/23 1733 124/72     Systolic BP Percentile --      Diastolic BP Percentile --      Pulse Rate 03/16/23 1733 (!) 58     Resp 03/16/23 1733 18     Temp 03/16/23 1733 98.2 F (36.8 C)     Temp Source 03/16/23 1733  Oral     SpO2 03/16/23 1733 98 %     Weight --      Height --      Head Circumference --      Peak Flow --      Pain Score 03/16/23 1731 0     Pain Loc --      Pain Education --      Exclude from Growth Chart --    No data found.  Updated Vital Signs BP 124/72 (BP Location: Left Arm)   Pulse (!) 58   Temp 98.2 F (36.8 C) (Oral)   Resp 18   LMP 03/02/2023 (Approximate)   SpO2 98%   Visual Acuity Right Eye Distance:   Left Eye Distance:   Bilateral Distance:    Right Eye Near:   Left Eye Near:    Bilateral Near:     Physical Exam Vitals reviewed.  Constitutional:      General: She is not in acute distress.    Appearance: She is not ill-appearing, toxic-appearing or diaphoretic.  Skin:    Coloration: Skin is not jaundiced or pale.  Neurological:     General: No  focal deficit present.     Mental Status: She is alert and oriented to person, place, and time.  Psychiatric:        Behavior: Behavior normal.      UC Treatments / Results  Labs (all labs ordered are listed, but only abnormal results are displayed) Labs Reviewed  CERVICOVAGINAL ANCILLARY ONLY    EKG   Radiology No results found.  Procedures Procedures (including critical care time)  Medications Ordered in UC Medications - No data to display  Initial Impression / Assessment and Plan / UC Course  I have reviewed the triage vital signs and the nursing notes.  Pertinent labs & imaging results that were available during my care of the patient were reviewed by me and considered in my medical decision making (see chart for details).     Vaginal self swab is done, and we will notify of any positives on that and treat per protocol.  Empiric treatment for BV is sent in with metronidazole Final Clinical Impressions(s) / UC Diagnoses   Final diagnoses:  Acute vaginitis   Discharge Instructions   None    ED Prescriptions     Medication Sig Dispense Auth. Provider   metroNIDAZOLE  (FLAGYL) 500 MG tablet Take 1 tablet (500 mg total) by mouth 2 (two) times daily for 7 days. 14 tablet Quang Thorpe, Janace Aris, MD      PDMP not reviewed this encounter.   Zenia Resides, MD 03/16/23 [phone]

## 2023-03-17 LAB — CERVICOVAGINAL ANCILLARY ONLY
Bacterial Vaginitis (gardnerella): POSITIVE — AB
Candida Glabrata: NEGATIVE
Candida Vaginitis: NEGATIVE
Chlamydia: NEGATIVE
Comment: NEGATIVE
Comment: NEGATIVE
Comment: NEGATIVE
Comment: NEGATIVE
Comment: NEGATIVE
Comment: NORMAL
Neisseria Gonorrhea: NEGATIVE
Trichomonas: NEGATIVE

## 2023-04-29 ENCOUNTER — Telehealth (HOSPITAL_COMMUNITY): Payer: Self-pay | Admitting: *Deleted

## 2023-09-03 ENCOUNTER — Ambulatory Visit (HOSPITAL_COMMUNITY): Payer: Self-pay

## 2023-10-01 ENCOUNTER — Emergency Department (HOSPITAL_BASED_OUTPATIENT_CLINIC_OR_DEPARTMENT_OTHER)
Admission: EM | Admit: 2023-10-01 | Discharge: 2023-10-01 | Disposition: A | Discharge status: Home / Self Care | Attending: Emergency Medicine | Admitting: Emergency Medicine

## 2023-10-01 ENCOUNTER — Other Ambulatory Visit: Payer: Self-pay

## 2023-10-01 ENCOUNTER — Emergency Department (HOSPITAL_BASED_OUTPATIENT_CLINIC_OR_DEPARTMENT_OTHER)

## 2023-10-01 ENCOUNTER — Encounter (HOSPITAL_BASED_OUTPATIENT_CLINIC_OR_DEPARTMENT_OTHER): Payer: Self-pay | Admitting: *Deleted

## 2023-10-01 DIAGNOSIS — R142 Eructation: Secondary | ICD-10-CM | POA: Diagnosis not present

## 2023-10-01 DIAGNOSIS — M545 Low back pain, unspecified: Secondary | ICD-10-CM | POA: Insufficient documentation

## 2023-10-01 DIAGNOSIS — K5900 Constipation, unspecified: Secondary | ICD-10-CM | POA: Diagnosis not present

## 2023-10-01 HISTORY — DX: Anemia, unspecified: D64.9

## 2023-10-01 LAB — URINALYSIS, ROUTINE W REFLEX MICROSCOPIC
Bilirubin Urine: NEGATIVE
Glucose, UA: NEGATIVE mg/dL
Hgb urine dipstick: NEGATIVE
Ketones, ur: NEGATIVE mg/dL
Nitrite: NEGATIVE
Protein, ur: NEGATIVE mg/dL
Specific Gravity, Urine: 1.02 (ref 1.005–1.030)
pH: 7 (ref 5.0–8.0)

## 2023-10-01 LAB — URINALYSIS, MICROSCOPIC (REFLEX)

## 2023-10-01 LAB — CBC WITH DIFFERENTIAL/PLATELET
Abs Immature Granulocytes: 0.02 10*3/uL (ref 0.00–0.07)
Basophils Absolute: 0.1 10*3/uL (ref 0.0–0.1)
Basophils Relative: 1 %
Eosinophils Absolute: 0.1 10*3/uL (ref 0.0–0.5)
Eosinophils Relative: 1 %
HCT: 38 % (ref 36.0–46.0)
Hemoglobin: 12.3 g/dL (ref 12.0–15.0)
Immature Granulocytes: 0 %
Lymphocytes Relative: 32 %
Lymphs Abs: 2.9 10*3/uL (ref 0.7–4.0)
MCH: 26.9 pg (ref 26.0–34.0)
MCHC: 32.4 g/dL (ref 30.0–36.0)
MCV: 83.2 fL (ref 80.0–100.0)
Monocytes Absolute: 0.7 10*3/uL (ref 0.1–1.0)
Monocytes Relative: 8 %
Neutro Abs: 5.1 10*3/uL (ref 1.7–7.7)
Neutrophils Relative %: 58 %
Platelets: 214 10*3/uL (ref 150–400)
RBC: 4.57 MIL/uL (ref 3.87–5.11)
RDW: 14 % (ref 11.5–15.5)
WBC: 8.9 10*3/uL (ref 4.0–10.5)
nRBC: 0 % (ref 0.0–0.2)

## 2023-10-01 LAB — COMPREHENSIVE METABOLIC PANEL WITH GFR
ALT: 9 U/L (ref 0–44)
AST: 15 U/L (ref 15–41)
Albumin: 4.3 g/dL (ref 3.5–5.0)
Alkaline Phosphatase: 62 U/L (ref 38–126)
Anion gap: 11 (ref 5–15)
BUN: 10 mg/dL (ref 6–20)
CO2: 24 mmol/L (ref 22–32)
Calcium: 9.2 mg/dL (ref 8.9–10.3)
Chloride: 102 mmol/L (ref 98–111)
Creatinine, Ser: 0.88 mg/dL (ref 0.44–1.00)
GFR, Estimated: >60 mL/min (ref >60)
Glucose, Bld: 100 mg/dL — ABNORMAL HIGH (ref 70–99)
Potassium: 3.9 mmol/L (ref 3.5–5.1)
Sodium: 136 mmol/L (ref 135–145)
Total Bilirubin: 0.4 mg/dL (ref 0.0–1.2)
Total Protein: 7.2 g/dL (ref 6.5–8.1)

## 2023-10-01 LAB — LIPASE, BLOOD: Lipase: 26 U/L (ref 11–51)

## 2023-10-01 LAB — PREGNANCY, URINE: Preg Test, Ur: NEGATIVE

## 2023-10-01 MED ORDER — IOHEXOL 300 MG/ML  SOLN
100.0000 mL | Freq: Once | INTRAMUSCULAR | Status: AC | PRN
  Administered 2023-10-01: 100 mL via INTRAVENOUS

## 2023-10-01 NOTE — Discharge Instructions (Addendum)
 Return if any problems.  Try miralax twice a day for the next 2 days.  Use a fleets enema to relieve constipation.  Schedule to see your provider for recheck.  The radiologist has advised that you need a mri to evaluate your liver further.

## 2023-10-01 NOTE — ED Provider Notes (Signed)
 [place] [facility] Provider Note   CSN: [id number] Arrival date & time: 10/01/23  1630     History  Chief Complaint  Patient presents with   Back Pain    Karina Cook is a 33 y.o. female.  Patient complains of left upper quadrant abdominal pain and belching for the past 4 weeks patient reports that she has had persistent belching.  Patient saw her primary provider and was told that she had an enlarged gas bubble in the left upper quadrant.  Patient reports that she has been having increased discomfort.  Patient denies any fever or chills she has not had any vomiting.  Patient reports she has had increased constipation.  Patient reports that she has had previous issues with constipation.  Patient denies any previous bowel surgery.  Patient reports she had a tubal ligation.  Patient is not aware of any fever or chills.  The history is provided by the patient. No language interpreter was used.  Back Pain Location:  Generalized Quality:  Aching Radiates to:  Does not radiate Associated symptoms: abdominal pain   Abdominal Pain Pain location:  LUQ Pain quality: aching   Pain radiates to:  L flank Pain severity:  Moderate Onset quality:  Gradual Timing:  Constant Progression:  Worsening Chronicity:  New Context: not sick contacts        Home Medications Prior to Admission medications   Medication Sig Start Date End Date Taking? Authorizing Provider  EPINEPHrine (EPIPEN 2-PAK) 0.3 mg/0.3 mL IJ SOAJ injection Inject into the muscle. 04/02/21   [provider]      Allergies    Fish allergy and Hydrocodone     Review of Systems   Review of Systems  Gastrointestinal:  Positive for abdominal pain.  Musculoskeletal:  Positive for back pain.  All other systems reviewed and are negative.   Physical Exam Updated Vital Signs BP 99/73 (BP Location: Left Arm)   Pulse (!) 57   Temp 98.1 F (36.7 C) (Oral)   Resp 16   Wt 88.5 kg    LMP 09/10/2023 (Exact Date)   SpO2 100%   BMI 29.65 kg/m  Physical Exam Vitals and nursing note reviewed.  Constitutional:      Appearance: She is well-developed.  HENT:     Head: Normocephalic.     Comments: Frequent belching    Nose: Nose normal.     Mouth/Throat:     Mouth: Mucous membranes are moist.  Cardiovascular:     Rate and Rhythm: Normal rate.  Pulmonary:     Effort: Pulmonary effort is normal.  Abdominal:     General: Abdomen is flat. There is no distension.     Palpations: Abdomen is soft.     Tenderness: There is abdominal tenderness.  Musculoskeletal:        General: Normal range of motion.     Cervical back: Normal range of motion.  Skin:    General: Skin is warm.  Neurological:     General: No focal deficit present.     Mental Status: She is alert and oriented to person, place, and time.  Psychiatric:        Mood and Affect: Mood normal.     ED Results / Procedures / Treatments   Labs (all labs ordered are listed, but only abnormal results are displayed) Labs Reviewed  COMPREHENSIVE METABOLIC PANEL WITH GFR - Abnormal; Notable for the following components:      Result Value  Glucose, Bld 100 (*)    All other components within normal limits  URINALYSIS, ROUTINE W REFLEX MICROSCOPIC - Abnormal; Notable for the following components:   Leukocytes,Ua TRACE (*)    All other components within normal limits  URINALYSIS, MICROSCOPIC (REFLEX) - Abnormal; Notable for the following components:   Bacteria, UA FEW (*)    All other components within normal limits  PREGNANCY, URINE  CBC WITH DIFFERENTIAL/PLATELET  LIPASE, BLOOD    EKG None  Radiology CT ABDOMEN PELVIS W CONTRAST Result Date: 10/01/2023 CLINICAL DATA:  Lower back pain EXAM: CT ABDOMEN AND PELVIS WITH CONTRAST TECHNIQUE: Multidetector CT imaging of the abdomen and pelvis was performed using the standard protocol following bolus administration of intravenous contrast. RADIATION DOSE  REDUCTION: This exam was performed according to the departmental dose-optimization program which includes automated exposure control, adjustment of the mA and/or kV according to patient size and/or use of iterative reconstruction technique. CONTRAST:  OMNIPAQUE IOHEXOL 300 MG/ML  SOLN COMPARISON:  Radiograph 05/10/2023 FINDINGS: Lower chest: No acute abnormality. Hepatobiliary: No calcified gallstone. No biliary dilatation. Subtle focal hyperenhancement within the left lobe of the liver, measures about 3.5 x 2 cm on series 4 image 20. Pancreas: Unremarkable. No pancreatic ductal dilatation or surrounding inflammatory changes. Spleen: Normal in size without focal abnormality. Adrenals/Urinary Tract: Adrenal glands are unremarkable. Kidneys are normal, without renal calculi, focal lesion, or hydronephrosis. Bladder is unremarkable. Stomach/Bowel: Stomach is within normal limits. No evidence of bowel wall thickening, distention, or inflammatory changes. Vascular/Lymphatic: No significant vascular findings are present. No enlarged abdominal or pelvic lymph nodes. Reproductive: Uterus and bilateral adnexa are unremarkable. Other: Trace free fluid in the pelvis.  No free air Musculoskeletal: No acute or significant osseous findings. IMPRESSION: 1. No CT evidence for acute intra-abdominal or pelvic abnormality. 2. Trace free fluid in the pelvis, likely physiologic. 3. Subtle focal hyperenhancement within the left lobe of the liver, measures about 3.5 x 2 cm. Uncertain if this is due to perfusion anomaly or a subtle liver mass. When the patient is clinically stable and able to follow directions and hold their breath (preferably as an outpatient) further evaluation with dedicated abdominal MRI should be considered. Electronically Signed   By: Esmeralda Hedge M.D.   On: 10/01/2023 22:14    Procedures Procedures    Medications Ordered in ED Medications  iohexol (OMNIPAQUE) 300 MG/ML solution 100 mL (100 mLs  Intravenous Contrast Given 10/01/23 2202)    ED Course/ Medical Decision Making/ A&P                                 Medical Decision Making Patient complains of left flank pain upper left abdominal pain and belching for the past 4 weeks.  Patient reports that she has a history of constipation.  Patient reports she has not had a bowel movement in the past week.  Amount and/or Complexity of Data Reviewed Labs: ordered. Decision-making details documented in ED Course.    Details: Labs ordered reviewed and interpreted.      Radiology: ordered.    Details: CT abdomen and pelvis shows increased liver density radiologist advised MRI outpatient for follow-up.  Risk Prescription drug management. Risk Details: Patient appears to have increased stool on CT scan.  Patient is advised to try a fleets enema and MiraLAX twice a day for the next 3 days.  Patient is advised to follow-up with her primary care physician for  recheck and to schedule MRI.           Final Clinical Impression(s) / ED Diagnoses Final diagnoses:  Acute left-sided low back pain without sciatica  Belching  Constipation, unspecified constipation type    Rx / DC Orders ED Discharge Orders     None      An After Visit Summary was printed and given to the patient.    Sandi Crosby, PA-C 10/01/23 2302    Afton Horse T, DO 10/04/23 [phone]

## 2023-10-01 NOTE — ED Triage Notes (Signed)
 Here by POV from home for L lower back pain, ongoing for ~ 1 month, gradually progressively worse, endorses belching, pain radiation into LLQ, constipation, rectal bleeding. Denies urinary sx, NV or fever. Mentions started with diarrhea, took OTC antidiarrheal, then became constipated. Last BM 8d ago.

## 2023-12-14 ENCOUNTER — Encounter (HOSPITAL_BASED_OUTPATIENT_CLINIC_OR_DEPARTMENT_OTHER): Payer: Self-pay | Admitting: Emergency Medicine

## 2023-12-14 ENCOUNTER — Emergency Department (HOSPITAL_BASED_OUTPATIENT_CLINIC_OR_DEPARTMENT_OTHER)
Admission: EM | Admit: 2023-12-14 | Discharge: 2023-12-14 | Discharge status: Against Medical Advice | Attending: Emergency Medicine | Admitting: Emergency Medicine

## 2023-12-14 ENCOUNTER — Other Ambulatory Visit: Payer: Self-pay

## 2023-12-14 DIAGNOSIS — Z5321 Procedure and treatment not carried out due to patient leaving prior to being seen by health care provider: Secondary | ICD-10-CM | POA: Diagnosis not present

## 2023-12-14 DIAGNOSIS — S7011XA Contusion of right thigh, initial encounter: Secondary | ICD-10-CM | POA: Diagnosis present

## 2023-12-14 DIAGNOSIS — R109 Unspecified abdominal pain: Secondary | ICD-10-CM | POA: Diagnosis not present

## 2023-12-14 DIAGNOSIS — X58XXXA Exposure to other specified factors, initial encounter: Secondary | ICD-10-CM | POA: Diagnosis not present

## 2023-12-14 DIAGNOSIS — S7012XA Contusion of left thigh, initial encounter: Secondary | ICD-10-CM | POA: Diagnosis not present

## 2023-12-14 NOTE — ED Triage Notes (Signed)
 Pt POv reports recurrent L flank pain since May.  Pain worse today, denies new injury, difficulty with urination, hx of kidney stones.    Also c/o bruising of unknown origin to BL thighs.

## 2024-05-16 ENCOUNTER — Encounter
